# Patient Record
Sex: Male | Born: 1970 | Race: Black or African American | Hispanic: No | Marital: Single | Smoking: Former smoker
Health system: Southern US, Community
[De-identification: ages and names within clinical notes are randomized; demographics above are authoritative.]

## PROBLEM LIST (undated history)

## (undated) HISTORY — PX: HERNIA REPAIR: SHX51

---

## 2010-11-18 ENCOUNTER — Emergency Department (HOSPITAL_COMMUNITY)
Admission: EM | Admit: 2010-11-18 | Discharge: 2010-11-18 | Payer: Self-pay | Source: Home / Self Care | Admitting: Emergency Medicine

## 2010-12-15 ENCOUNTER — Emergency Department (HOSPITAL_COMMUNITY)
Admission: EM | Admit: 2010-12-15 | Discharge: 2010-12-15 | Payer: Self-pay | Source: Home / Self Care | Admitting: Emergency Medicine

## 2016-04-28 ENCOUNTER — Encounter (HOSPITAL_COMMUNITY): Payer: Self-pay | Admitting: Emergency Medicine

## 2016-04-28 ENCOUNTER — Emergency Department (HOSPITAL_COMMUNITY)
Admission: EM | Admit: 2016-04-28 | Discharge: 2016-04-29 | Disposition: A | Discharge status: Home / Self Care | Payer: Self-pay | Attending: Emergency Medicine | Admitting: Emergency Medicine

## 2016-04-28 ENCOUNTER — Emergency Department (HOSPITAL_COMMUNITY): Payer: Self-pay

## 2016-04-28 DIAGNOSIS — F10920 Alcohol use, unspecified with intoxication, uncomplicated: Secondary | ICD-10-CM

## 2016-04-28 DIAGNOSIS — F1290 Cannabis use, unspecified, uncomplicated: Secondary | ICD-10-CM | POA: Insufficient documentation

## 2016-04-28 DIAGNOSIS — F10120 Alcohol abuse with intoxication, uncomplicated: Secondary | ICD-10-CM | POA: Insufficient documentation

## 2016-04-28 DIAGNOSIS — F17200 Nicotine dependence, unspecified, uncomplicated: Secondary | ICD-10-CM | POA: Insufficient documentation

## 2016-04-28 LAB — CBC WITH DIFFERENTIAL/PLATELET
BASOS ABS: 0.1 10*3/uL (ref 0.0–0.1)
BASOS PCT: 1 %
EOS PCT: 1 %
Eosinophils Absolute: 0.1 10*3/uL (ref 0.0–0.7)
HCT: 41.6 % (ref 39.0–52.0)
Hemoglobin: 14.2 g/dL (ref 13.0–17.0)
LYMPHS PCT: 43 %
Lymphs Abs: 4.5 10*3/uL — ABNORMAL HIGH (ref 0.7–4.0)
MCH: 32.8 pg (ref 26.0–34.0)
MCHC: 34.1 g/dL (ref 30.0–36.0)
MCV: 96.1 fL (ref 78.0–100.0)
Monocytes Absolute: 0.5 10*3/uL (ref 0.1–1.0)
Monocytes Relative: 5 %
NEUTROS ABS: 5.4 10*3/uL (ref 1.7–7.7)
Neutrophils Relative %: 50 %
PLATELETS: 292 10*3/uL (ref 150–400)
RBC: 4.33 MIL/uL (ref 4.22–5.81)
RDW: 12.6 % (ref 11.5–15.5)
WBC: 10.6 10*3/uL — AB (ref 4.0–10.5)

## 2016-04-28 LAB — URINALYSIS, ROUTINE W REFLEX MICROSCOPIC
Bilirubin Urine: NEGATIVE
GLUCOSE, UA: NEGATIVE mg/dL
Hgb urine dipstick: NEGATIVE
KETONES UR: NEGATIVE mg/dL
LEUKOCYTES UA: NEGATIVE
NITRITE: NEGATIVE
PH: 5 (ref 5.0–8.0)
Protein, ur: NEGATIVE mg/dL
SPECIFIC GRAVITY, URINE: 1.01 (ref 1.005–1.030)

## 2016-04-28 LAB — COMPREHENSIVE METABOLIC PANEL
ALBUMIN: 3.9 g/dL (ref 3.5–5.0)
ALK PHOS: 89 U/L (ref 38–126)
ALT: 54 U/L (ref 17–63)
ANION GAP: 6 (ref 5–15)
AST: 67 U/L — ABNORMAL HIGH (ref 15–41)
BUN: 9 mg/dL (ref 6–20)
CALCIUM: 8.2 mg/dL — AB (ref 8.9–10.3)
CHLORIDE: 110 mmol/L (ref 101–111)
CO2: 25 mmol/L (ref 22–32)
Creatinine, Ser: 1.02 mg/dL (ref 0.61–1.24)
GFR calc Af Amer: >60 mL/min (ref >60)
GFR calc non Af Amer: >60 mL/min (ref >60)
GLUCOSE: 106 mg/dL — AB (ref 65–99)
POTASSIUM: 3.8 mmol/L (ref 3.5–5.1)
SODIUM: 141 mmol/L (ref 135–145)
Total Bilirubin: 0.6 mg/dL (ref 0.3–1.2)
Total Protein: 8.1 g/dL (ref 6.5–8.1)

## 2016-04-28 LAB — RAPID URINE DRUG SCREEN, HOSP PERFORMED
AMPHETAMINES: NOT DETECTED
BENZODIAZEPINES: NOT DETECTED
Barbiturates: NOT DETECTED
COCAINE: NOT DETECTED
Opiates: NOT DETECTED
Tetrahydrocannabinol: POSITIVE — AB

## 2016-04-28 LAB — ACETAMINOPHEN LEVEL: Acetaminophen (Tylenol), Serum: <10 ug/mL — ABNORMAL LOW (ref 10–30)

## 2016-04-28 LAB — SALICYLATE LEVEL

## 2016-04-28 LAB — ETHANOL: Alcohol, Ethyl (B): 356 mg/dL (ref <5)

## 2016-04-28 MED ORDER — SODIUM CHLORIDE 0.9 % IV BOLUS (SEPSIS)
1000.0000 mL | Freq: Once | INTRAVENOUS | Status: AC
  Administered 2016-04-28: 1000 mL via INTRAVENOUS

## 2016-04-28 NOTE — ED Provider Notes (Signed)
CSN: [id number]     Arrival date & time 04/28/16  2206 History   First MD Initiated Contact with Patient 04/28/16 2216     Chief Complaint  Patient presents with  . Drug Overdose   PT WAS FOUND DOWNTOWN UNRESPONSIVE.  PER EMS, PT WAS HAVING AGONAL RESPIRATIONS AND WAS GIVEN 2 MG OF NARCAN INTRANASALLY.  THE PT IS NOW AWAKE AND ALERT.  HE DENIES HEROIN USE OR ANY OTHER DRUGS.  THE PT SAID HE DRANK A LOT TONIGHT.  IT HAS BEEN RAINING AND PT'S CLOTHES ARE WET.  (Consider location/radiation/quality/duration/timing/severity/associated sxs/prior Treatment) Patient is a 45 y.o. male presenting with Overdose. The history is provided by the patient.  Drug Overdose This is a new problem. The current episode started less than 1 hour ago. The problem occurs constantly. The problem has been rapidly improving. Nothing aggravates the symptoms.    History reviewed. No pertinent past medical history. Past Surgical History  Procedure Laterality Date  . Hernia repair     Family History  Problem Relation Age of Onset  . Aneurysm Mother    Social History  Substance Use Topics  . Smoking status: Current Every Day Smoker  . Smokeless tobacco: None  . Alcohol Use: Yes    Review of Systems  All other systems reviewed and are negative.     Allergies  Review of patient's allergies indicates no known allergies.  Home Medications   Prior to Admission medications   Not on File   BP 136/94 mmHg  Pulse 68  Temp(Src) 97.9 F (36.6 C) (Oral)  Resp 10  SpO2 93% Physical Exam  Constitutional: He is oriented to person, place, and time. He appears well-developed and well-nourished.  HENT:  Head: Normocephalic and atraumatic.  Right Ear: External ear normal.  Left Ear: External ear normal.  Nose: Nose normal.  Mouth/Throat: Oropharynx is clear and moist.  Eyes: Conjunctivae and EOM are normal. Pupils are equal, round, and reactive to light.  Neck: Normal range of motion. Neck supple.   Cardiovascular: Normal rate, regular rhythm, normal heart sounds and intact distal pulses.   Pulmonary/Chest: Effort normal and breath sounds normal.  Abdominal: Soft. Bowel sounds are normal.  Musculoskeletal: Normal range of motion.  Neurological: He is alert and oriented to person, place, and time.  Skin: Skin is warm and dry.  Psychiatric: He has a normal mood and affect. His behavior is normal. Judgment and thought content normal.  Nursing note and vitals reviewed.   ED Course  Procedures (including critical care time) Labs Review Labs Reviewed  COMPREHENSIVE METABOLIC PANEL - Abnormal; Notable for the following:    Glucose, Bld 106 (*)    Calcium 8.2 (*)    AST 67 (*)    All other components within normal limits  ETHANOL - Abnormal; Notable for the following:    Alcohol, Ethyl (B) 356 (*)    All other components within normal limits  CBC WITH DIFFERENTIAL/PLATELET - Abnormal; Notable for the following:    WBC 10.6 (*)    Lymphs Abs 4.5 (*)    All other components within normal limits  URINE RAPID DRUG SCREEN, HOSP PERFORMED - Abnormal; Notable for the following:    Tetrahydrocannabinol POSITIVE (*)    All other components within normal limits  ACETAMINOPHEN LEVEL - Abnormal; Notable for the following:    Acetaminophen (Tylenol), Serum <10 (*)    All other components within normal limits  SALICYLATE LEVEL  URINALYSIS, ROUTINE W REFLEX MICROSCOPIC (NOT AT [facility])  Imaging Review Dg Chest 2 View  04/28/2016  CLINICAL DATA:  Pt states he was downtown and had been drinking and that is all he remembers Pt is alert and oriented at this time No acute distress noted. Unable to articulate any complaints. Smoker. EXAM: CHEST - 2 VIEW COMPARISON:  11/18/2010 FINDINGS: Lungs are clear. Heart size and mediastinal contours are within normal limits. No effusion. Visualized bones unremarkable. IMPRESSION: No acute cardiopulmonary disease. Electronically Signed   By: Corlis Leak  Hassell M.D.   On:  04/28/2016 22:47   I have personally reviewed and evaluated these images and lab results as part of my medical decision-making.   EKG Interpretation   Date/Time:  Wednesday April 28 2016 22:13:24 EDT Ventricular Rate:  70 PR Interval:  194 QRS Duration: 81 QT Interval:  393 QTC Calculation: 424 R Axis:   67 Text Interpretation:  Sinus rhythm RSR' in V1 or V2, probably normal  variant Nonspecific T abnormalities, diffuse leads Confirmed by Nhan Qualley  MD, Jaquanna Ballentine ([id number]) on 04/28/2016 11:08:50 PM      MDM  PLAN IS TO OBSERVE AND REASSESS ONCE PT IS SOBER. Final diagnoses:  Alcohol intoxication, uncomplicated (HCC)     Jacalyn LefevreJulie Tailer Volkert, MD 05/03/16 [phone]

## 2016-04-28 NOTE — ED Notes (Signed)
Pt states he was downtown and had been drinking and that is all he remembers  Pt is alert and oriented at this time  No acute distress noted

## 2016-04-28 NOTE — ED Notes (Signed)
Patient transported to X-ray 

## 2016-04-28 NOTE — ED Notes (Signed)
Per EMS pt was found laying on the sidewalk downtown with agonal respirations  Fire dept gave Narcan 2 mg IN on scene  Pt is now alert and oriented x 3  Pt states he feels tired  Denies opiate or narcotic use  Pt states he has been drinking alcohol and smoking weed

## 2016-04-28 NOTE — ED Notes (Addendum)
Critical value alcohol 356, notified Dr. Particia NearingHaviland and RN.

## 2016-04-29 NOTE — ED Notes (Signed)
Patient given water and a sandwich. Patient asked where he was and stated he doesn't remember anything that happened last night prior to coming to the ED

## 2016-04-29 NOTE — ED Notes (Signed)
Unable to complete CIWA because pt is sleeping; will monitor vital signs and perform CIWA when pt awakens

## 2016-04-29 NOTE — ED Notes (Signed)
Pt ambulated in hall with no assist. 

## 2016-04-29 NOTE — Discharge Instructions (Signed)
Alcohol Intoxication  Alcohol intoxication occurs when the amount of alcohol that a person has consumed impairs his or her ability to mentally and physically function. Alcohol directly impairs the normal chemical activity of the brain. Drinking large amounts of alcohol can lead to changes in mental function and behavior, and it can cause many physical effects that can be harmful.   Alcohol intoxication can range in severity from mild to very severe. Various factors can affect the level of intoxication that occurs, such as the person's age, gender, weight, frequency of alcohol consumption, and the presence of other medical conditions (such as diabetes, seizures, or heart conditions). Dangerous levels of alcohol intoxication may occur when people drink large amounts of alcohol in a short period (binge drinking). Alcohol can also be especially dangerous when combined with certain prescription medicines or "recreational" drugs.  SIGNS AND SYMPTOMS  Some common signs and symptoms of mild alcohol intoxication include:  · Loss of coordination.  · Changes in mood and behavior.  · Impaired judgment.  · Slurred speech.  As alcohol intoxication progresses to more severe levels, other signs and symptoms will appear. These may include:  · Vomiting.  · Confusion and impaired memory.  · Slowed breathing.  · Seizures.  · Loss of consciousness.  DIAGNOSIS   Your health care provider will take a medical history and perform a physical exam. You will be asked about the amount and type of alcohol you have consumed. Blood tests will be done to measure the concentration of alcohol in your blood. In many places, your blood alcohol level must be lower than 80 mg/dL (0.08%) to legally drive. However, many dangerous effects of alcohol can occur at much lower levels.   TREATMENT   People with alcohol intoxication often do not require treatment. Most of the effects of alcohol intoxication are temporary, and they go away as the alcohol naturally  leaves the body. Your health care provider will monitor your condition until you are stable enough to go home. Fluids are sometimes given through an IV access tube to help prevent dehydration.   HOME CARE INSTRUCTIONS  · Do not drive after drinking alcohol.  · Stay hydrated. Drink enough water and fluids to keep your urine clear or pale yellow. Avoid caffeine.    · Only take over-the-counter or prescription medicines as directed by your health care provider.    SEEK MEDICAL CARE IF:   · You have persistent vomiting.    · You do not feel better after a few days.  · You have frequent alcohol intoxication. Your health care provider can help determine if you should see a substance use treatment counselor.  SEEK IMMEDIATE MEDICAL CARE IF:   · You become shaky or tremble when you try to stop drinking.    · You shake uncontrollably (seizure).    · You throw up (vomit) blood. This may be bright red or may look like black coffee grounds.    · You have blood in your stool. This may be bright red or may appear as a black, tarry, bad smelling stool.    · You become lightheaded or faint.    MAKE SURE YOU:   · Understand these instructions.  · Will watch your condition.  · Will get help right away if you are not doing well or get worse.     This information is not intended to replace advice given to you by your health care provider. Make sure you discuss any questions you have with your health care provider.       Document Released: 08/11/2005 Document Revised: 07/04/2013 Document Reviewed: 04/06/2013  Elsevier Interactive Patient Education ©2016 [employer]

## 2016-04-29 NOTE — ED Notes (Signed)
Pt noted to ambulate in hall w/o difficulty or assistance.  Pt c/o generalized soreness.

## 2016-04-29 NOTE — ED Notes (Signed)
Verbalized understanding discharge instructions and given a bus pass. In no acute distress.

## 2016-04-29 NOTE — ED Provider Notes (Signed)
Patient initially seen and evaluated by Dr. Particia NearingHaviland, noted to be intoxicated and was observed in the ED overnight. He slept through the night without incident. In the morning, he was ambulated and was able to ambulate without difficulty. At this point, he was felt to be stable to go home and was discharged.  Dione Boozeavid Bryanah Sidell, MD 04/29/16 [phone]

## 2016-05-10 ENCOUNTER — Encounter (HOSPITAL_COMMUNITY): Payer: Self-pay | Admitting: Emergency Medicine

## 2016-05-10 ENCOUNTER — Emergency Department (HOSPITAL_COMMUNITY)
Admission: EM | Admit: 2016-05-10 | Discharge: 2016-05-10 | Disposition: A | Discharge status: Home / Self Care | Payer: Self-pay | Attending: Emergency Medicine | Admitting: Emergency Medicine

## 2016-05-10 ENCOUNTER — Emergency Department (HOSPITAL_COMMUNITY): Payer: Self-pay

## 2016-05-10 DIAGNOSIS — Y999 Unspecified external cause status: Secondary | ICD-10-CM | POA: Insufficient documentation

## 2016-05-10 DIAGNOSIS — Y929 Unspecified place or not applicable: Secondary | ICD-10-CM | POA: Insufficient documentation

## 2016-05-10 DIAGNOSIS — Y9301 Activity, walking, marching and hiking: Secondary | ICD-10-CM | POA: Insufficient documentation

## 2016-05-10 DIAGNOSIS — W19XXXA Unspecified fall, initial encounter: Secondary | ICD-10-CM

## 2016-05-10 DIAGNOSIS — F17200 Nicotine dependence, unspecified, uncomplicated: Secondary | ICD-10-CM | POA: Insufficient documentation

## 2016-05-10 DIAGNOSIS — W010XXA Fall on same level from slipping, tripping and stumbling without subsequent striking against object, initial encounter: Secondary | ICD-10-CM | POA: Insufficient documentation

## 2016-05-10 DIAGNOSIS — S3210XA Unspecified fracture of sacrum, initial encounter for closed fracture: Secondary | ICD-10-CM | POA: Insufficient documentation

## 2016-05-10 MED ORDER — TRAMADOL HCL 50 MG PO TABS
50.0000 mg | ORAL_TABLET | Freq: Once | ORAL | Status: AC
  Administered 2016-05-10: 50 mg via ORAL
  Filled 2016-05-10: qty 1

## 2016-05-10 MED ORDER — OXYCODONE-ACETAMINOPHEN 5-325 MG PO TABS: 2.0000 | ORAL_TABLET | ORAL | Status: DC | PRN

## 2016-05-10 NOTE — ED Notes (Signed)
EDP at bedside  

## 2016-05-10 NOTE — ED Provider Notes (Signed)
CSN: [id number]     Arrival date & time 05/10/16  0622 History   First MD Initiated Contact with Patient 05/10/16 [phone]     Chief Complaint  Patient presents with  . Back Pain  . Fall   HPI   45 YOM Presents status post fall. Patient reports he was walking backwards trip landed on his body. He reports pain at that time, continued pain through this morning. Patient reports pain with ambulation, pain with palpation. He denies any radiation of symptoms, denies any loss of distal sensation strength or motor function. No other injuries noted. Patient reports using Tylenol this morning with mild relief of symptoms.Patient denies abdominal pain.   History reviewed. No pertinent past medical history. Past Surgical History  Procedure Laterality Date  . Hernia repair     Family History  Problem Relation Age of Onset  . Aneurysm Mother    Social History  Substance Use Topics  . Smoking status: Current Every Day Smoker  . Smokeless tobacco: None  . Alcohol Use: Yes    Review of Systems  All other systems reviewed and are negative.   Allergies  Review of patient's allergies indicates no known allergies.  Home Medications   Prior to Admission medications   Medication Sig Start Date End Date Taking? Authorizing Provider  acetaminophen (TYLENOL) 500 MG tablet Take 500 mg by mouth every 6 (six) hours as needed for mild pain.   Yes Historical Provider, MD  oxyCODONE-acetaminophen (PERCOCET/ROXICET) 5-325 MG tablet Take 2 tablets by mouth every 4 (four) hours as needed for severe pain. 05/10/16   Andre Swander, PA-C   BP 133/95 mmHg  Pulse 67  Temp(Src) 97.9 F (36.6 C) (Oral)  Resp 16  Ht 6\' 2"  (1.88 m)  Wt 79.379 kg  BMI 22.46 kg/m2  SpO2 98%   Physical Exam  Constitutional: He is oriented to person, place, and time. He appears well-developed and well-nourished.  HENT:  Head: Normocephalic and atraumatic.  Eyes: Conjunctivae are normal. Pupils are equal, round, and reactive to  light. Right eye exhibits no discharge. Left eye exhibits no discharge. No scleral icterus.  Neck: Normal range of motion. No JVD present. No tracheal deviation present.  Pulmonary/Chest: Effort normal. No stridor.  Abdominal: Soft. He exhibits no distension and no mass. There is no tenderness. There is no rebound and no guarding.  Musculoskeletal:  No obvious deformities noted of the back or hips. Patient tender to palpation of the sacrum and surrounding soft tissue. Distal motor sensation and strength intact. Patellar reflexes 2+  Neurological: He is alert and oriented to person, place, and time. Coordination normal.  Skin: Skin is warm and dry. No rash noted. No erythema. No pallor.  Psychiatric: He has a normal mood and affect. His behavior is normal. Judgment and thought content normal.  Nursing note and vitals reviewed.   ED Course  Procedures (including critical care time) Labs Review Labs Reviewed - No data to display  Imaging Review Dg Lumbar Spine Complete  05/10/2016  CLINICAL DATA:  Pain following fall 1 day prior EXAM: LUMBAR SPINE - COMPLETE 4+ VIEW COMPARISON:  None. FINDINGS: Frontal, lateral, spot lumbosacral lateral, and bilateral oblique views were obtained. There are 5 non-rib-bearing lumbar type vertebral bodies. There is mild lumbar levoscoliosis. There is a transitional lumbosacral vertebra. There is no fracture or spondylolisthesis. There is slight disc space narrowing at L5-S1. Other disc spaces appear normal. There is no appreciable facet arthropathy. IMPRESSION: Slight disc space narrowing L5-S1. No  fracture or spondylolisthesis. There is mild lumbar levoscoliosis. Electronically Signed   By: Bretta BangWilliam  Woodruff III M.D.   On: 05/10/2016 07:58   Dg Sacrum/coccyx  05/10/2016  CLINICAL DATA:  Pain following fall EXAM: SACRUM AND COCCYX - 2+ VIEW COMPARISON:  None. FINDINGS: Frontal and lateral views were obtained. There is a lucency extending through the midportion of the  sacrum, better seen on the left side. This lucency is concerning for a nondisplaced fracture of the mid sacrum. No other fracture is evident. No diastases. The joint spaces appear normal. No erosive change. IMPRESSION: Evidence of nondisplaced fracture mid sacrum, better seen on the left side. No diastases evident. No appreciable arthropathic change. Electronically Signed   By: Bretta BangWilliam  Woodruff III M.D.   On: 05/10/2016 08:00   I have personally reviewed and evaluated these images and lab results as part of my medical decision-making.   EKG Interpretation None      MDM   Final diagnoses:  Fall  Closed fracture of sacrum, unspecified fracture morphology, initial encounter [facility](HCC)   Labs:   Imaging:  Consults:  Therapeutics:  Discharge Meds:   Assessment/Plan:  45 year old male presents today with sacral fracture. Nondisplaced, pain management in the ED. Patient was given a work note, more thorough follow-up, pain to medication. Strict return precautions given.        Eyvonne MechanicJeffrey Jaimie Pippins, PA-C 05/10/16 [id number]  Blane OharaJoshua Zavitz, MD 05/10/16 [phone]

## 2016-05-10 NOTE — Discharge Instructions (Signed)
Please Follow-up with orthopedic specialist for further evaluation. Please return immediately if new or worsening signs or symptoms present

## 2016-05-10 NOTE — ED Notes (Signed)
Pt verbalized understanding of d/c instructions, prescriptions, and follow-up care. No further questions/concerns, VSS, ambulatory w/ steady gait (refused wheelchair) 

## 2016-05-10 NOTE — ED Notes (Signed)
Pt in reports falling backwards last night and landed on back, no reports 8/10 lower back pain. Denies numbness/tingling, bowel/bladder incontinence.

## 2016-05-29 ENCOUNTER — Encounter (HOSPITAL_COMMUNITY): Payer: Self-pay | Admitting: Emergency Medicine

## 2016-05-29 ENCOUNTER — Emergency Department (HOSPITAL_COMMUNITY)
Admission: EM | Admit: 2016-05-29 | Discharge: 2016-05-30 | Disposition: A | Discharge status: Home / Self Care | Payer: Self-pay | Attending: Emergency Medicine | Admitting: Emergency Medicine

## 2016-05-29 DIAGNOSIS — Z5181 Encounter for therapeutic drug level monitoring: Secondary | ICD-10-CM | POA: Insufficient documentation

## 2016-05-29 DIAGNOSIS — F17200 Nicotine dependence, unspecified, uncomplicated: Secondary | ICD-10-CM | POA: Insufficient documentation

## 2016-05-29 DIAGNOSIS — F1010 Alcohol abuse, uncomplicated: Secondary | ICD-10-CM | POA: Insufficient documentation

## 2016-05-29 DIAGNOSIS — F1410 Cocaine abuse, uncomplicated: Secondary | ICD-10-CM | POA: Insufficient documentation

## 2016-05-29 DIAGNOSIS — F1290 Cannabis use, unspecified, uncomplicated: Secondary | ICD-10-CM | POA: Insufficient documentation

## 2016-05-29 DIAGNOSIS — E876 Hypokalemia: Secondary | ICD-10-CM | POA: Insufficient documentation

## 2016-05-29 LAB — CBC
HEMATOCRIT: 38.7 % — AB (ref 39.0–52.0)
Hemoglobin: 13.3 g/dL (ref 13.0–17.0)
MCH: 33.1 pg (ref 26.0–34.0)
MCHC: 34.4 g/dL (ref 30.0–36.0)
MCV: 96.3 fL (ref 78.0–100.0)
PLATELETS: 227 10*3/uL (ref 150–400)
RBC: 4.02 MIL/uL — ABNORMAL LOW (ref 4.22–5.81)
RDW: 11.9 % (ref 11.5–15.5)
WBC: 8 10*3/uL (ref 4.0–10.5)

## 2016-05-29 LAB — BASIC METABOLIC PANEL
ANION GAP: 7 (ref 5–15)
BUN: 10 mg/dL (ref 6–20)
CALCIUM: 8 mg/dL — AB (ref 8.9–10.3)
CO2: 28 mmol/L (ref 22–32)
Chloride: 109 mmol/L (ref 101–111)
Creatinine, Ser: 0.77 mg/dL (ref 0.61–1.24)
Glucose, Bld: 116 mg/dL — ABNORMAL HIGH (ref 65–99)
Potassium: 3.4 mmol/L — ABNORMAL LOW (ref 3.5–5.1)
SODIUM: 144 mmol/L (ref 135–145)

## 2016-05-29 LAB — ETHANOL: ALCOHOL ETHYL (B): 324 mg/dL — AB (ref <5)

## 2016-05-29 LAB — ACETAMINOPHEN LEVEL

## 2016-05-29 LAB — SALICYLATE LEVEL

## 2016-05-29 MED ORDER — SODIUM CHLORIDE 0.9 % IV BOLUS (SEPSIS)
500.0000 mL | Freq: Once | INTRAVENOUS | Status: AC
Start: 2016-05-29 — End: 2016-05-29
  Administered 2016-05-29: 500 mL via INTRAVENOUS

## 2016-05-29 NOTE — ED Notes (Signed)
Pt found by someone in the lobby bathroom of the [facility] [address], initially found by EMS awake, alert to person, place.  Pt awake, alert on arrival to ED, IV est by EMS, NS 200ml given.

## 2016-05-29 NOTE — ED Provider Notes (Signed)
CSN: [id number]     Arrival date & time 05/29/16  1945 History   By signing my name below, I, Walter Sherman, attest that this documentation has been prepared under the direction and in the presence of Walter Sherman  Electronically Signed: Vista Minkobert Sherman, ED Scribe. 05/29/2016. 8:57 PM   Chief Complaint  Patient presents with  . Alcohol Intoxication   The history is provided by the patient and medical records. No language interpreter was used.  HPI Comments: Walter Sherman is a 45 y.o. male who presents to the Emergency Department s/p being picked up by EMS for alcohol intoxication approximately one hour ago. Per EMS, pt was found in the lobby bathroom of the [facility] [address] Pt was reportedly initially found alert and oriented to time and person. Pt states he did not know where he was found; denies fall or injury. Pt states [birth year] four days ago and that he has been drinking all day. When pt was asked how much he drank today; he reports "I don't know. A lot of things." Pt states that he drinks regularly but denies drinking more than usual today. Pt reports left shoulder pain and soreness but is unable to recall any injury.  He is unable to provide additional hx as he continues to fall asleep.    LEVEL 5 CAVEAT for intoxication.    No past medical history on file. Past Surgical History  Procedure Laterality Date  . Hernia repair     Family History  Problem Relation Age of Onset  . Aneurysm Mother    Social History  Substance Use Topics  . Smoking status: Current Every Day Smoker  . Smokeless tobacco: Not on file  . Alcohol Use: 6.0 oz/week    10 Cans of beer per week     Comment: pt reports drinking several shots and beers tonight    Review of Systems  Unable to perform ROS: Other  Musculoskeletal: Positive for arthralgias (left shoulder).  All other systems reviewed and are negative.     Allergies  Review of patient's allergies indicates no known  allergies.  Home Medications   Prior to Admission medications   Medication Sig Start Date End Date Taking? Authorizing Provider  oxyCODONE-acetaminophen (PERCOCET/ROXICET) 5-325 MG tablet Take 2 tablets by mouth every 4 (four) hours as needed for severe pain. Patient not taking: Reported on 05/29/2016 05/10/16   Eyvonne MechanicJeffrey Hedges, PA-C   BP 129/107 mmHg  Pulse 73  Temp(Src) 97.8 F (36.6 C) (Oral)  Resp 23  SpO2 99% Physical Exam  Constitutional: He appears well-developed and well-nourished. He appears lethargic. No distress.  Patient lethargic  HENT:  Head: Normocephalic and atraumatic.  Mouth/Throat: Oropharynx is clear and moist. No oropharyngeal exudate.  Eyes: Conjunctivae are normal. Pupils are equal, round, and reactive to light. No scleral icterus.  PERRL but sluggish  Neck: Normal range of motion. Neck supple.  No midline or paraspinal tenderness.  Full passive and active ROM  Cardiovascular: Normal rate, regular rhythm, normal heart sounds and intact distal pulses.   Pulmonary/Chest: Effort normal and breath sounds normal. No respiratory distress. He has no wheezes.  Equal chest expansion  Abdominal: Soft. Bowel sounds are normal. He exhibits no mass. There is no tenderness. There is no rebound and no guarding.  Musculoskeletal: Normal range of motion. He exhibits no edema.  Full ROM of bilateral upper and lower extremities. No joint swelling, edema or deformities. Pt complains of pain to the left  shoulder on ROM and palpation. No midline t-spine or l-spine tenderness.   Neurological: He appears lethargic.  Speech is garbled but not slurred; pt answers questions appropriately when stimulated He is oriented to person and place Moves extremities without ataxia  Skin: Skin is warm and dry. No rash noted. He is not diaphoretic.  Psychiatric: He has a normal mood and affect.  Nursing note and vitals reviewed.   ED Course  Procedures  DIAGNOSTIC STUDIES: Oxygen Saturation  is 94% on RA, adequate by my interpretation.  COORDINATION OF CARE: 8:37 PM-Will order blood work. Discussed treatment plan with pt at bedside and pt agreed to plan.   Labs Review Labs Reviewed  CBC - Abnormal; Notable for the following:    RBC 4.02 (*)    HCT 38.7 (*)    All other components within normal limits  BASIC METABOLIC PANEL - Abnormal; Notable for the following:    Potassium 3.4 (*)    Glucose, Bld 116 (*)    Calcium 8.0 (*)    All other components within normal limits  ETHANOL - Abnormal; Notable for the following:    Alcohol, Ethyl (B) 324 (*)    All other components within normal limits  ACETAMINOPHEN LEVEL - Abnormal; Notable for the following:    Acetaminophen (Tylenol), Serum <10 (*)    All other components within normal limits  URINE RAPID DRUG SCREEN, HOSP PERFORMED - Abnormal; Notable for the following:    Cocaine POSITIVE (*)    All other components within normal limits  SALICYLATE LEVEL     MDM   Final diagnoses:  ETOH abuse  Cocaine abuse  Hypokalemia  Walter Sherman presents with acute EtOH intoxication.  Pt denies fall and undressed exam does not reveal visible injury to the head, neck or body.  Pt c/o soreness in the left shoulder but has FROM without difficulty or significant pain.    2:01 AM Labs are reassuring. Patient is alert, oriented and ambulatory without difficulty. He is tolerating by mouth. He states that he feels better. Patient now admits to alcohol and cocaine usage.  He continues to move all joints of the bilateral upper and lower extremities without difficulty.  No other new complaints of pain. He is clinically sober and stable for discharge home.  Mild hypokalemia repleated in the department.    I personally performed the services described in this documentation, which was scribed in my presence. The recorded information has been reviewed and is accurate.   Walter Client [name], PA-C 05/30/16 1610  Nelva Nay, MD 05/30/16  [phone]

## 2016-05-29 NOTE — ED Notes (Signed)
Bed: RESA Expected date:  Expected time:  Means of arrival:  Comments: [age] M Unresponsive/ETOH

## 2016-05-29 NOTE — ED Notes (Signed)
PT presents with alcohol intoxication after drinking throughout the day. PT currently alert and reporting nausea. Pt oriented to time and person.

## 2016-05-30 LAB — RAPID URINE DRUG SCREEN, HOSP PERFORMED
AMPHETAMINES: NOT DETECTED
BARBITURATES: NOT DETECTED
BENZODIAZEPINES: NOT DETECTED
COCAINE: POSITIVE — AB
Opiates: NOT DETECTED
Tetrahydrocannabinol: NOT DETECTED

## 2016-05-30 MED ORDER — POTASSIUM CHLORIDE CRYS ER 20 MEQ PO TBCR
40.0000 meq | EXTENDED_RELEASE_TABLET | Freq: Two times a day (BID) | ORAL | Status: DC
  Administered 2016-05-30: 40 meq via ORAL
  Filled 2016-05-30: qty 2

## 2016-05-30 NOTE — ED Notes (Signed)
Pt resting on stretcher with eyes closed, RR even and unlabored, pt will be d/c closer to morning, ok per charge.

## 2016-05-30 NOTE — ED Notes (Signed)
HOB raised, pt now sitting up, given Sprite and crackers.

## 2016-05-30 NOTE — ED Notes (Signed)
Pt now eating sandwich and drinking sprite

## 2016-05-30 NOTE — Discharge Instructions (Signed)
1. Medications: usual home medications °2. Treatment: rest, drink plenty of fluids,  °3. Follow Up: Please followup with your primary doctor in 2-3 days for discussion of your diagnoses and further evaluation after today's visit; if you do not have a primary care doctor use the resource guide provided to find one; Please return to the ER for worsening symptoms ° °

## 2016-05-30 NOTE — ED Notes (Signed)
Pt given bus pass for transportation. Pt verbalized understanding of d/c instructions.

## 2016-05-30 NOTE — ED Notes (Signed)
Pt resting on stretcher with eyes closed, RR even and unlabored,. Pt unable to stay awake to complete conversation

## 2016-06-02 ENCOUNTER — Emergency Department (HOSPITAL_COMMUNITY): Payer: Self-pay

## 2016-06-02 ENCOUNTER — Emergency Department (HOSPITAL_COMMUNITY)
Admission: EM | Admit: 2016-06-02 | Discharge: 2016-06-03 | Disposition: A | Discharge status: Home / Self Care | Payer: Self-pay | Attending: Emergency Medicine | Admitting: Emergency Medicine

## 2016-06-02 ENCOUNTER — Encounter (HOSPITAL_COMMUNITY): Payer: Self-pay | Admitting: *Deleted

## 2016-06-02 DIAGNOSIS — S02640A Fracture of ramus of mandible, unspecified side, initial encounter for closed fracture: Secondary | ICD-10-CM

## 2016-06-02 DIAGNOSIS — S02642A Fracture of ramus of left mandible, initial encounter for closed fracture: Secondary | ICD-10-CM | POA: Insufficient documentation

## 2016-06-02 DIAGNOSIS — S0990XA Unspecified injury of head, initial encounter: Secondary | ICD-10-CM | POA: Insufficient documentation

## 2016-06-02 DIAGNOSIS — S30811A Abrasion of abdominal wall, initial encounter: Secondary | ICD-10-CM | POA: Insufficient documentation

## 2016-06-02 DIAGNOSIS — Y999 Unspecified external cause status: Secondary | ICD-10-CM | POA: Insufficient documentation

## 2016-06-02 DIAGNOSIS — F17200 Nicotine dependence, unspecified, uncomplicated: Secondary | ICD-10-CM | POA: Insufficient documentation

## 2016-06-02 DIAGNOSIS — Y939 Activity, unspecified: Secondary | ICD-10-CM | POA: Insufficient documentation

## 2016-06-02 DIAGNOSIS — Y92512 Supermarket, store or market as the place of occurrence of the external cause: Secondary | ICD-10-CM | POA: Insufficient documentation

## 2016-06-02 DIAGNOSIS — F10120 Alcohol abuse with intoxication, uncomplicated: Secondary | ICD-10-CM | POA: Insufficient documentation
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
## 2016-06-02 NOTE — ED Notes (Signed)
Pt ambulated to nursing station in pod B, upset about pain in R ribcage and requesting food. Security to bedside. Pt assisted back to room. Given ginger ale to drink until CT results. Pt cooperative at this time

## 2016-06-03 ENCOUNTER — Emergency Department (HOSPITAL_COMMUNITY)
Admission: EM | Admit: 2016-06-03 | Discharge: 2016-06-04 | Disposition: A | Discharge status: Home / Self Care | Payer: Self-pay | Attending: Emergency Medicine | Admitting: Emergency Medicine

## 2016-06-03 ENCOUNTER — Emergency Department (HOSPITAL_COMMUNITY): Payer: Self-pay

## 2016-06-03 ENCOUNTER — Encounter (HOSPITAL_COMMUNITY): Payer: Self-pay | Admitting: *Deleted

## 2016-06-03 DIAGNOSIS — Y939 Activity, unspecified: Secondary | ICD-10-CM | POA: Insufficient documentation

## 2016-06-03 DIAGNOSIS — Z7982 Long term (current) use of aspirin: Secondary | ICD-10-CM | POA: Insufficient documentation

## 2016-06-03 DIAGNOSIS — Z79899 Other long term (current) drug therapy: Secondary | ICD-10-CM | POA: Insufficient documentation

## 2016-06-03 DIAGNOSIS — F17210 Nicotine dependence, cigarettes, uncomplicated: Secondary | ICD-10-CM | POA: Insufficient documentation

## 2016-06-03 DIAGNOSIS — F1490 Cocaine use, unspecified, uncomplicated: Secondary | ICD-10-CM | POA: Insufficient documentation

## 2016-06-03 DIAGNOSIS — F1290 Cannabis use, unspecified, uncomplicated: Secondary | ICD-10-CM | POA: Insufficient documentation

## 2016-06-03 DIAGNOSIS — S01412A Laceration without foreign body of left cheek and temporomandibular area, initial encounter: Secondary | ICD-10-CM | POA: Insufficient documentation

## 2016-06-03 DIAGNOSIS — S0285XA Fracture of orbit, unspecified, initial encounter for closed fracture: Secondary | ICD-10-CM

## 2016-06-03 DIAGNOSIS — S0232XA Fracture of orbital floor, left side, initial encounter for closed fracture: Secondary | ICD-10-CM | POA: Insufficient documentation

## 2016-06-03 DIAGNOSIS — IMO0002 Reserved for concepts with insufficient information to code with codable children: Secondary | ICD-10-CM

## 2016-06-03 DIAGNOSIS — Y999 Unspecified external cause status: Secondary | ICD-10-CM | POA: Insufficient documentation

## 2016-06-03 DIAGNOSIS — Y929 Unspecified place or not applicable: Secondary | ICD-10-CM | POA: Insufficient documentation

## 2016-06-03 DIAGNOSIS — S022XXA Fracture of nasal bones, initial encounter for closed fracture: Secondary | ICD-10-CM | POA: Insufficient documentation

## 2016-06-03 MED ORDER — ACETAMINOPHEN 500 MG PO TABS: 1000.0000 mg | ORAL_TABLET | Freq: Four times a day (QID) | ORAL | Status: DC | PRN

## 2016-06-03 MED ORDER — LIDOCAINE HCL (PF) 1 % IJ SOLN
5.0000 mL | Freq: Once | INTRAMUSCULAR | Status: AC
  Administered 2016-06-04: 5 mL
  Filled 2016-06-03: qty 30

## 2016-06-03 MED ORDER — HYDROCODONE-ACETAMINOPHEN 5-325 MG PO TABS
2.0000 | ORAL_TABLET | Freq: Once | ORAL | Status: AC
  Administered 2016-06-03: 2 via ORAL
  Filled 2016-06-03: qty 2
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
## 2016-06-04 NOTE — Discharge Instructions (Signed)
You have fractures of your left orbit, or the bones surrounding your left eye. Please follow-up with the specialists listed above in 3 days. Do NOT blow your nose.  The sutures in your left cheek will dissolve on there own. Return for redness, swelling, fever, or pus.

## 2016-06-04 NOTE — ED Notes (Signed)
Waiting for mother to pick him up.

## 2016-06-09 ENCOUNTER — Emergency Department (HOSPITAL_COMMUNITY)
Admission: EM | Admit: 2016-06-09 | Discharge: 2016-06-09 | Disposition: A | Discharge status: Home / Self Care | Payer: Self-pay | Attending: Emergency Medicine | Admitting: Emergency Medicine

## 2016-06-09 ENCOUNTER — Encounter (HOSPITAL_COMMUNITY): Payer: Self-pay | Admitting: Emergency Medicine

## 2016-06-09 DIAGNOSIS — Z87891 Personal history of nicotine dependence: Secondary | ICD-10-CM | POA: Insufficient documentation

## 2016-06-09 DIAGNOSIS — G43909 Migraine, unspecified, not intractable, without status migrainosus: Secondary | ICD-10-CM | POA: Insufficient documentation

## 2016-06-09 DIAGNOSIS — Z5321 Procedure and treatment not carried out due to patient leaving prior to being seen by health care provider: Secondary | ICD-10-CM | POA: Insufficient documentation

## 2016-06-09 NOTE — ED Triage Notes (Signed)
Pt c/o left facial swelling x5 days ago. Pt reports "hard bump" to left eye since yesterday. Denies SOB, throat/tongue swelling. Pt also reports headache.

## 2016-06-09 NOTE — ED Notes (Signed)
Patient left AMA.

## 2016-06-09 NOTE — ED Provider Notes (Signed)
Patient not in room when I went to see him   Mancel Bale, MD 06/09/16 [phone]

## 2016-06-10 ENCOUNTER — Emergency Department
Admission: EM | Admit: 2016-06-10 | Discharge: 2016-06-10 | Disposition: A | Discharge status: Home / Self Care | Payer: Self-pay | Attending: Emergency Medicine | Admitting: Emergency Medicine

## 2016-06-10 ENCOUNTER — Emergency Department: Payer: Self-pay

## 2016-06-10 ENCOUNTER — Encounter: Payer: Self-pay | Admitting: Emergency Medicine

## 2016-06-10 DIAGNOSIS — S0285XA Fracture of orbit, unspecified, initial encounter for closed fracture: Secondary | ICD-10-CM

## 2016-06-10 DIAGNOSIS — F1490 Cocaine use, unspecified, uncomplicated: Secondary | ICD-10-CM | POA: Insufficient documentation

## 2016-06-10 DIAGNOSIS — Z79899 Other long term (current) drug therapy: Secondary | ICD-10-CM | POA: Insufficient documentation

## 2016-06-10 DIAGNOSIS — S0510XA Contusion of eyeball and orbital tissues, unspecified eye, initial encounter: Secondary | ICD-10-CM

## 2016-06-10 DIAGNOSIS — IMO0001 Reserved for inherently not codable concepts without codable children: Secondary | ICD-10-CM

## 2016-06-10 DIAGNOSIS — Y999 Unspecified external cause status: Secondary | ICD-10-CM | POA: Insufficient documentation

## 2016-06-10 DIAGNOSIS — Z87891 Personal history of nicotine dependence: Secondary | ICD-10-CM | POA: Insufficient documentation

## 2016-06-10 DIAGNOSIS — S0232XA Fracture of orbital floor, left side, initial encounter for closed fracture: Secondary | ICD-10-CM | POA: Insufficient documentation

## 2016-06-10 DIAGNOSIS — Y929 Unspecified place or not applicable: Secondary | ICD-10-CM | POA: Insufficient documentation

## 2016-06-10 DIAGNOSIS — F1290 Cannabis use, unspecified, uncomplicated: Secondary | ICD-10-CM | POA: Insufficient documentation

## 2016-06-10 DIAGNOSIS — H5789 Other specified disorders of eye and adnexa: Secondary | ICD-10-CM

## 2016-06-10 DIAGNOSIS — T1490XA Injury, unspecified, initial encounter: Secondary | ICD-10-CM

## 2016-06-10 DIAGNOSIS — S0280XG Fracture of other specified skull and facial bones, unspecified side, subsequent encounter for fracture with delayed healing: Secondary | ICD-10-CM

## 2016-06-10 DIAGNOSIS — R51 Headache: Secondary | ICD-10-CM | POA: Insufficient documentation

## 2016-06-10 DIAGNOSIS — Y939 Activity, unspecified: Secondary | ICD-10-CM | POA: Insufficient documentation

## 2016-06-10 MED ORDER — OXYCODONE-ACETAMINOPHEN 5-325 MG PO TABS
2.0000 | ORAL_TABLET | Freq: Once | ORAL | Status: DC
  Filled 2016-06-10: qty 2

## 2016-06-10 MED ORDER — LIDOCAINE-EPINEPHRINE (PF) 1 %-1:200000 IJ SOLN
INTRAMUSCULAR | Status: AC
  Filled 2016-06-10: qty 30

## 2016-06-10 MED ORDER — AMOXICILLIN-POT CLAVULANATE 875-125 MG PO TABS: 1.0000 | ORAL_TABLET | Freq: Two times a day (BID) | ORAL | 0 refills | Status: DC

## 2016-06-10 MED ORDER — OXYCODONE-ACETAMINOPHEN 5-325 MG PO TABS: 1.0000 | ORAL_TABLET | ORAL | 0 refills | Status: DC | PRN

## 2016-06-10 MED ORDER — GENTAMICIN SULFATE 0.1 % EX OINT: 1.0000 "application " | TOPICAL_OINTMENT | Freq: Three times a day (TID) | CUTANEOUS | 0 refills | Status: AC
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
## 2016-06-10 NOTE — ED Notes (Signed)
Pt moved to major room 26 via w/c  Report called to [employer]
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
## 2016-06-10 NOTE — ED Notes (Signed)
Visual acuity right eye 20/10 with no difficulty.  Left eye 20/20 but can only read the first 3 letters of this line before it becomes too blurry to make out.  20/30 with no difficulty

## 2016-06-10 NOTE — ED Provider Notes (Signed)
-----------------------------------------   5:28 PM on 06/10/2016 -----------------------------------------  Dr. Andee Poles saw the patient in the ED.  He wrote prescriptions for pain and will see the patient in a week in clinic.     Loleta Rose, MD 06/10/16 [phone]

## 2017-04-10 ENCOUNTER — Emergency Department (HOSPITAL_COMMUNITY): Payer: Self-pay

## 2017-04-10 ENCOUNTER — Encounter (HOSPITAL_COMMUNITY): Payer: Self-pay | Admitting: Emergency Medicine

## 2017-04-10 ENCOUNTER — Emergency Department (HOSPITAL_COMMUNITY)
Admission: EM | Admit: 2017-04-10 | Discharge: 2017-04-10 | Disposition: A | Discharge status: Home / Self Care | Payer: Self-pay | Attending: Emergency Medicine | Admitting: Emergency Medicine

## 2017-04-10 DIAGNOSIS — S0101XA Laceration without foreign body of scalp, initial encounter: Secondary | ICD-10-CM | POA: Insufficient documentation

## 2017-04-10 DIAGNOSIS — Y9289 Other specified places as the place of occurrence of the external cause: Secondary | ICD-10-CM | POA: Insufficient documentation

## 2017-04-10 DIAGNOSIS — F10920 Alcohol use, unspecified with intoxication, uncomplicated: Secondary | ICD-10-CM | POA: Insufficient documentation

## 2017-04-10 DIAGNOSIS — S0003XA Contusion of scalp, initial encounter: Secondary | ICD-10-CM

## 2017-04-10 DIAGNOSIS — Y999 Unspecified external cause status: Secondary | ICD-10-CM | POA: Insufficient documentation

## 2017-04-10 DIAGNOSIS — Y93G9 Activity, other involving cooking and grilling: Secondary | ICD-10-CM | POA: Insufficient documentation

## 2017-04-10 DIAGNOSIS — W0110XA Fall on same level from slipping, tripping and stumbling with subsequent striking against unspecified object, initial encounter: Secondary | ICD-10-CM | POA: Insufficient documentation

## 2017-04-10 DIAGNOSIS — Z87891 Personal history of nicotine dependence: Secondary | ICD-10-CM | POA: Insufficient documentation

## 2017-04-10 DIAGNOSIS — F1210 Cannabis abuse, uncomplicated: Secondary | ICD-10-CM | POA: Insufficient documentation

## 2017-04-10 LAB — BASIC METABOLIC PANEL
Anion gap: 9 (ref 5–15)
BUN: 12 mg/dL (ref 6–20)
CO2: 26 mmol/L (ref 22–32)
Calcium: 8.2 mg/dL — ABNORMAL LOW (ref 8.9–10.3)
Chloride: 108 mmol/L (ref 101–111)
Creatinine, Ser: 1.07 mg/dL (ref 0.61–1.24)
GFR calc Af Amer: >60 mL/min (ref >60)
GFR calc non Af Amer: >60 mL/min (ref >60)
Glucose, Bld: 91 mg/dL (ref 65–99)
Potassium: 3.7 mmol/L (ref 3.5–5.1)
Sodium: 143 mmol/L (ref 135–145)

## 2017-04-10 LAB — CBC WITH DIFFERENTIAL/PLATELET
Basophils Absolute: 0 10*3/uL (ref 0.0–0.1)
Basophils Relative: 0 %
Eosinophils Absolute: 0.1 10*3/uL (ref 0.0–0.7)
Eosinophils Relative: 1 %
HCT: 39.8 % (ref 39.0–52.0)
Hemoglobin: 13.7 g/dL (ref 13.0–17.0)
Lymphocytes Relative: 32 %
Lymphs Abs: 2.9 10*3/uL (ref 0.7–4.0)
MCH: 33.4 pg (ref 26.0–34.0)
MCHC: 34.4 g/dL (ref 30.0–36.0)
MCV: 97.1 fL (ref 78.0–100.0)
Monocytes Absolute: 1 10*3/uL (ref 0.1–1.0)
Monocytes Relative: 11 %
Neutro Abs: 5.1 10*3/uL (ref 1.7–7.7)
Neutrophils Relative %: 56 %
Platelets: 199 10*3/uL (ref 150–400)
RBC: 4.1 MIL/uL — ABNORMAL LOW (ref 4.22–5.81)
RDW: 13.2 % (ref 11.5–15.5)
WBC: 9.2 10*3/uL (ref 4.0–10.5)

## 2017-04-10 LAB — ETHANOL: Alcohol, Ethyl (B): 197 mg/dL — ABNORMAL HIGH (ref <5)

## 2017-04-10 MED ORDER — LIDOCAINE-EPINEPHRINE (PF) 2 %-1:200000 IJ SOLN
10.0000 mL | Freq: Once | INTRAMUSCULAR | Status: AC
  Administered 2017-04-10: 10 mL
  Filled 2017-04-10: qty 20

## 2017-04-10 MED ORDER — BACITRACIN ZINC 500 UNIT/GM EX OINT
TOPICAL_OINTMENT | Freq: Once | CUTANEOUS | Status: DC
  Filled 2017-04-10: qty 0.9
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
## 2017-04-10 NOTE — Discharge Instructions (Signed)
Wash your wound 1-2 times daily. Apply antibiotic ointment daily. Please return to emergency department in 10-14 days for staple removal. Please return sooner if you develop any signs of infection including fever, redness, swelling, increased pain, or drainage from the area.

## 2017-04-10 NOTE — ED Notes (Signed)
PA sts pt needs to sober up before he can be d/c
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
## 2017-04-10 NOTE — ED Notes (Signed)
Pt sleeping quietly at this time.

## 2017-04-10 NOTE — ED Notes (Signed)
Ham sandwich and apple juice given

## 2017-04-10 NOTE — ED Notes (Signed)
PA at bedside to staple wound
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
## 2017-04-10 NOTE — ED Notes (Signed)
Pt wont wake up to urinate

## 2017-12-15 IMAGING — CR DG SACRUM/COCCYX 2+V
3 series · 3 of 3 positions shown · non-contrast
Comparison: None.

CLINICAL DATA: Pain following fall

EXAM:
SACRUM AND COCCYX - 2+ VIEW

[coccyx ap]
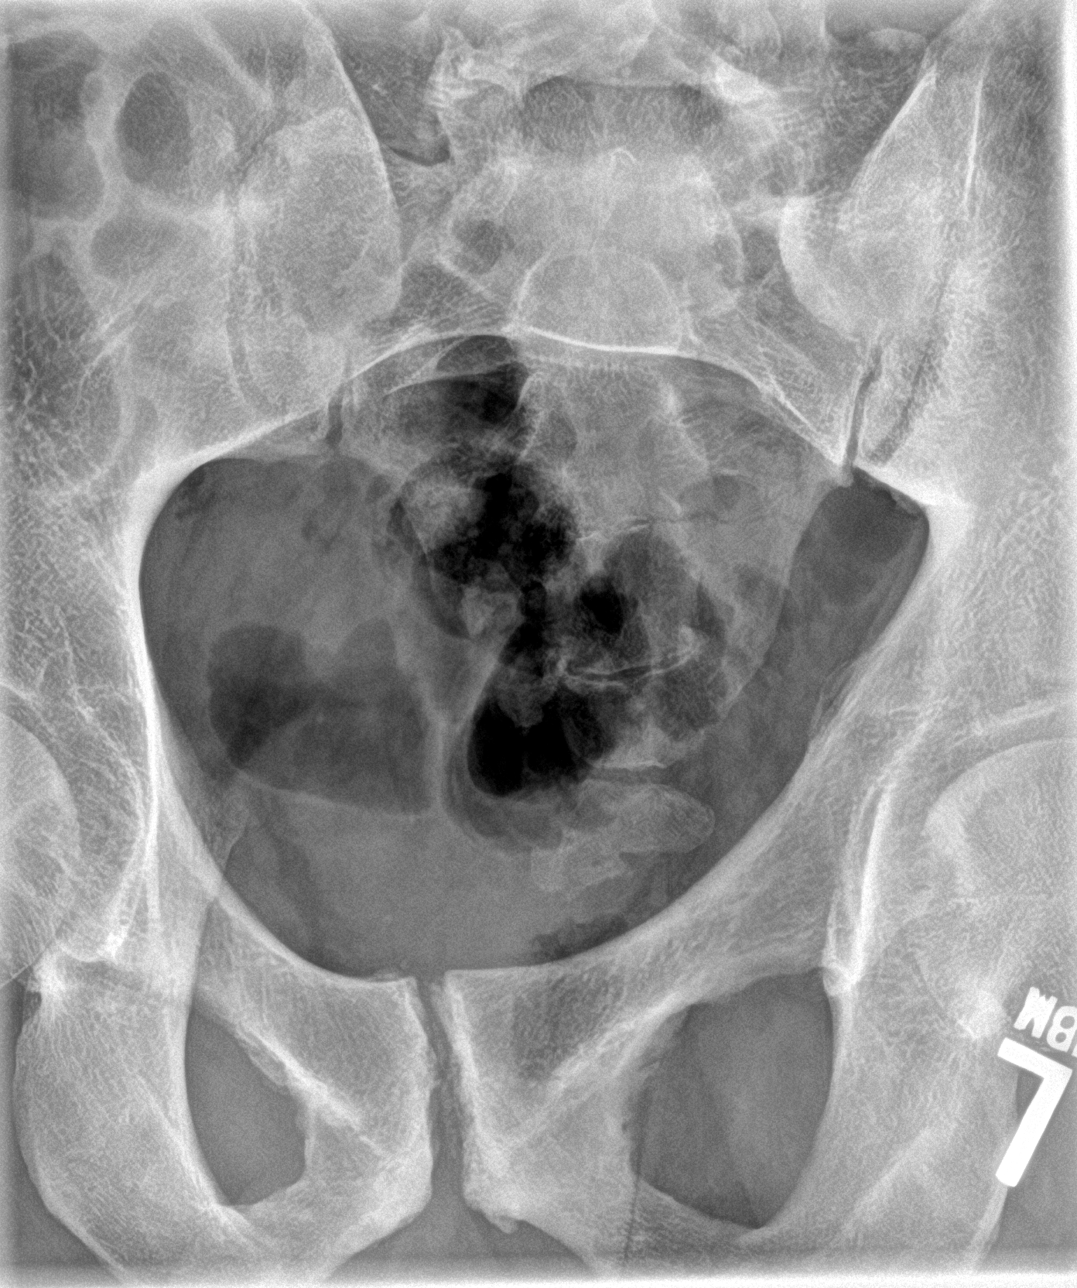

[sacrum ap]
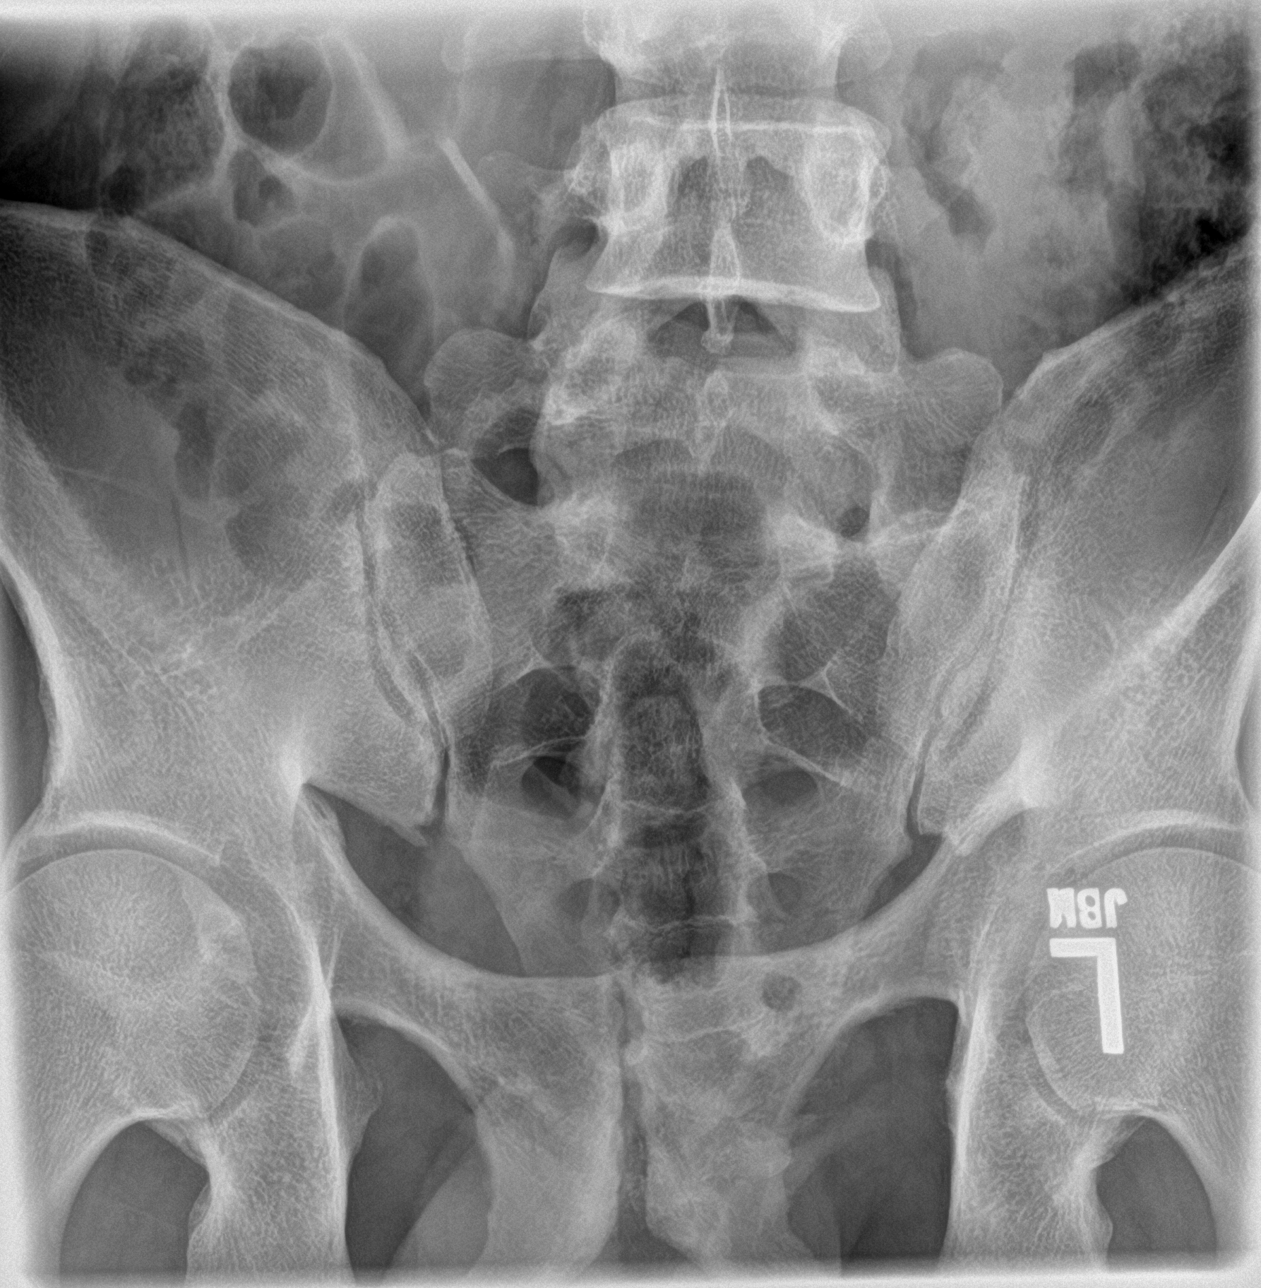

[sacrum lat]
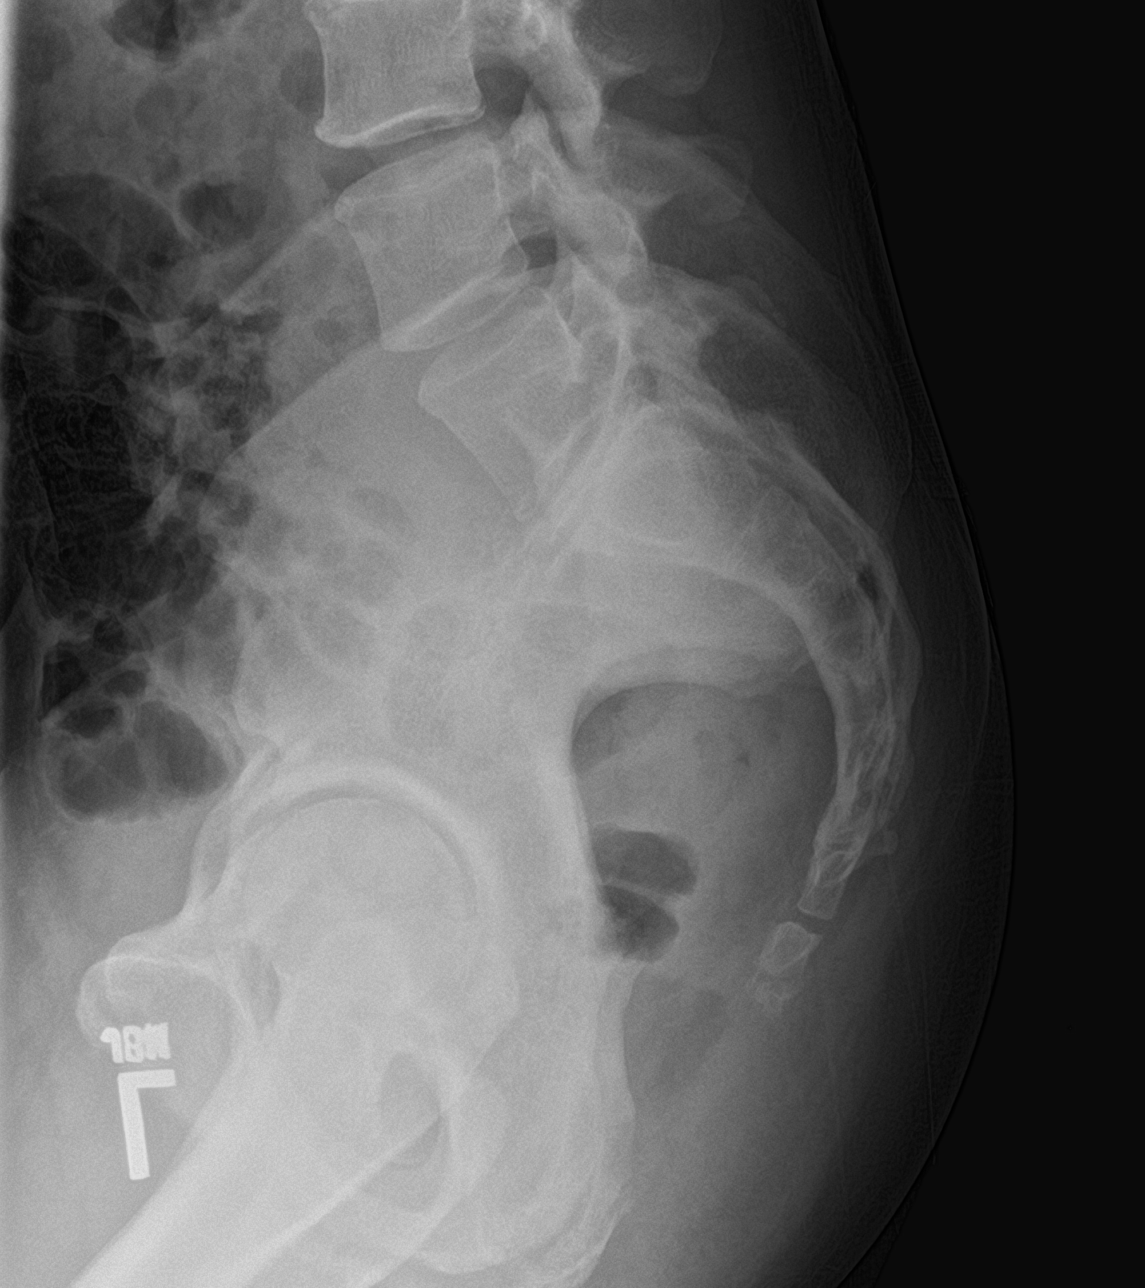

[3 of 3 positions shown; findings below may reference images not displayed]

FINDINGS: Frontal and lateral views were obtained. There is a lucency
extending through the midportion of the sacrum, better seen on the
left side. This lucency is concerning for a nondisplaced fracture of
the mid sacrum. No other fracture is evident. No diastases. The
joint spaces appear normal. No erosive change.
IMPRESSION: Evidence of nondisplaced fracture mid sacrum, better seen on the
left side. No diastases evident. No appreciable arthropathic change.

## 2017-12-15 IMAGING — CR DG LUMBAR SPINE COMPLETE 4+V
5 series · 5 of 5 positions shown · non-contrast
Comparison: None.

CLINICAL DATA: Pain following fall 1 day prior

EXAM:
LUMBAR SPINE - COMPLETE 4+ VIEW

[l-spine ap]
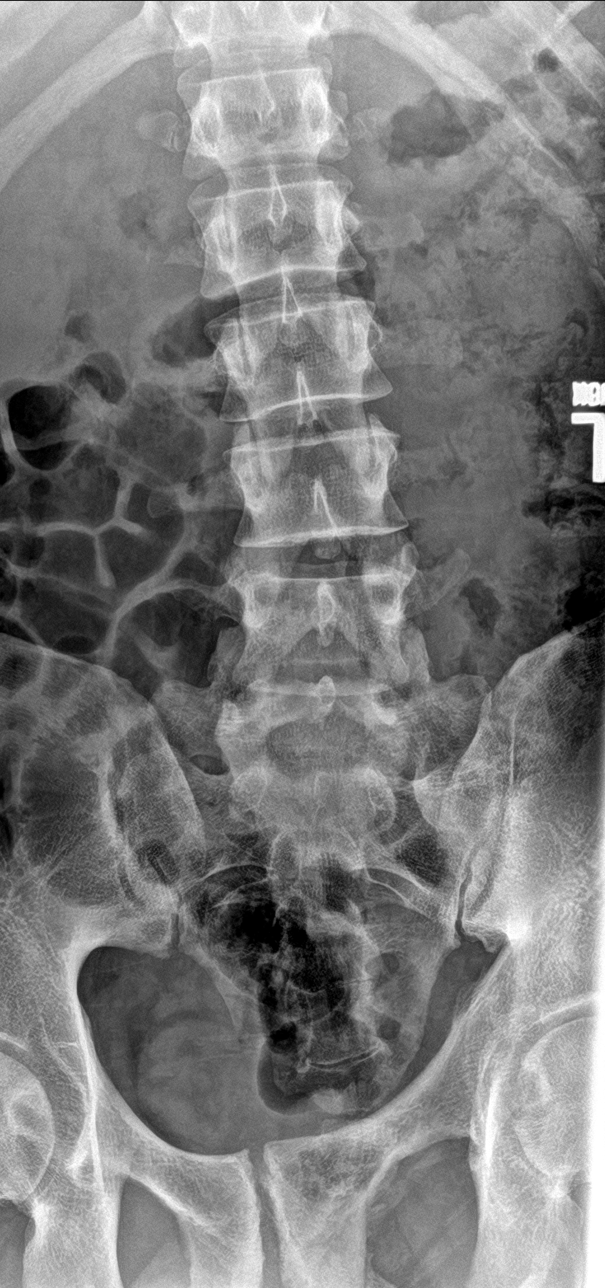

[l-spine obl (1 of 2)]
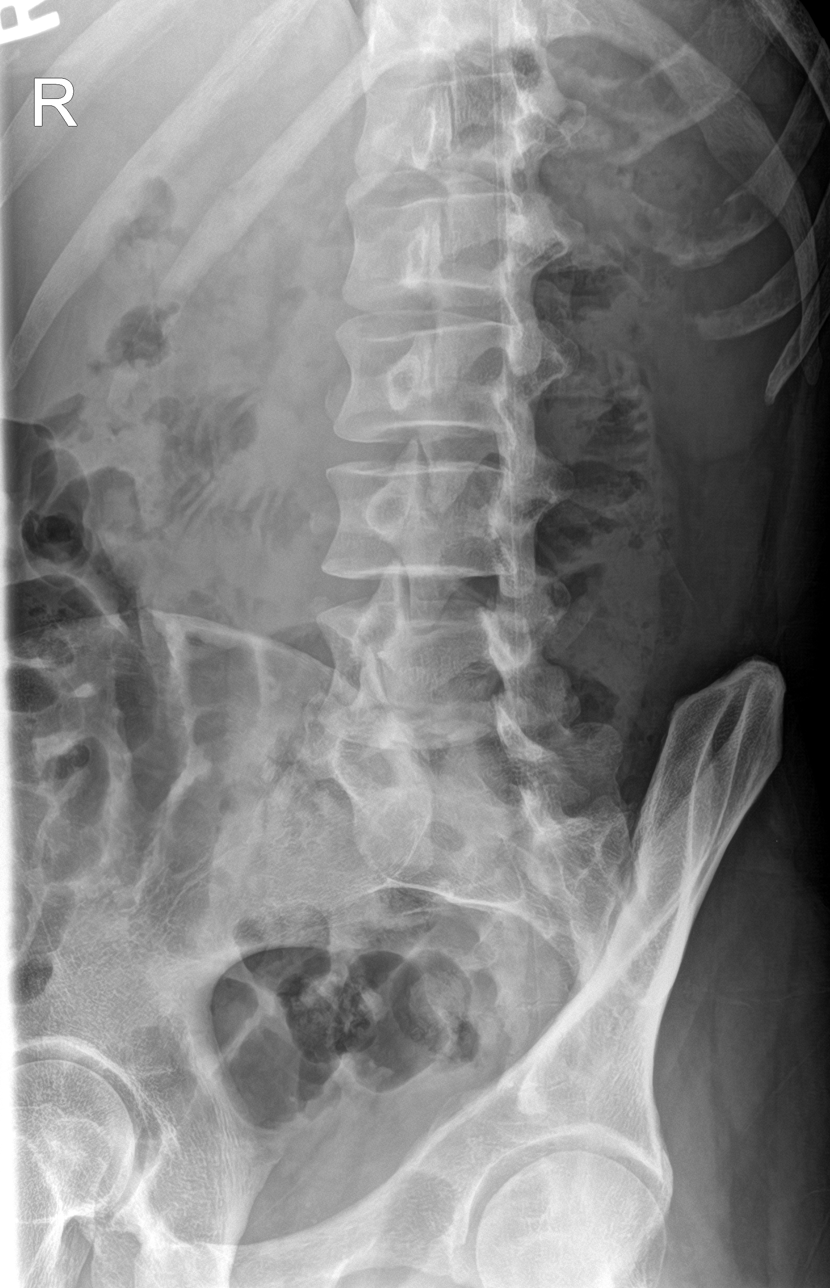

[l-spine obl (2 of 2)]
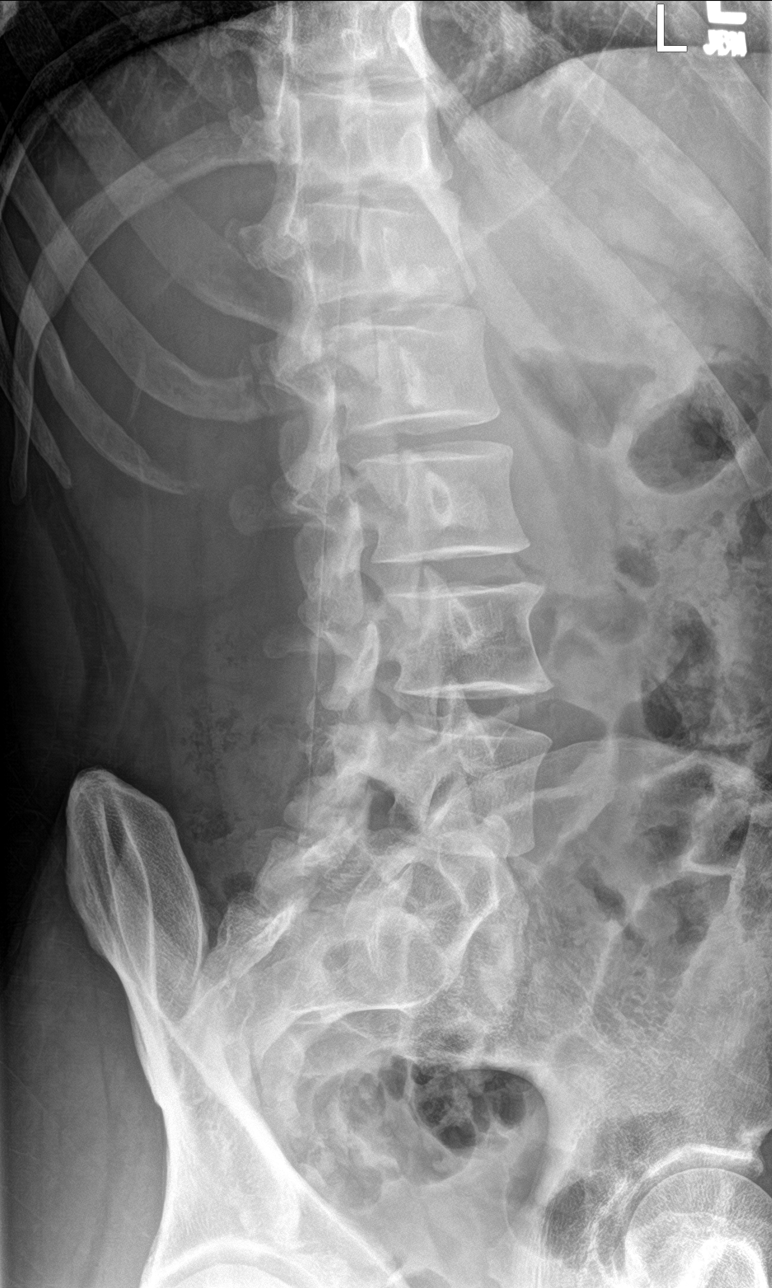

[l-spine lat]
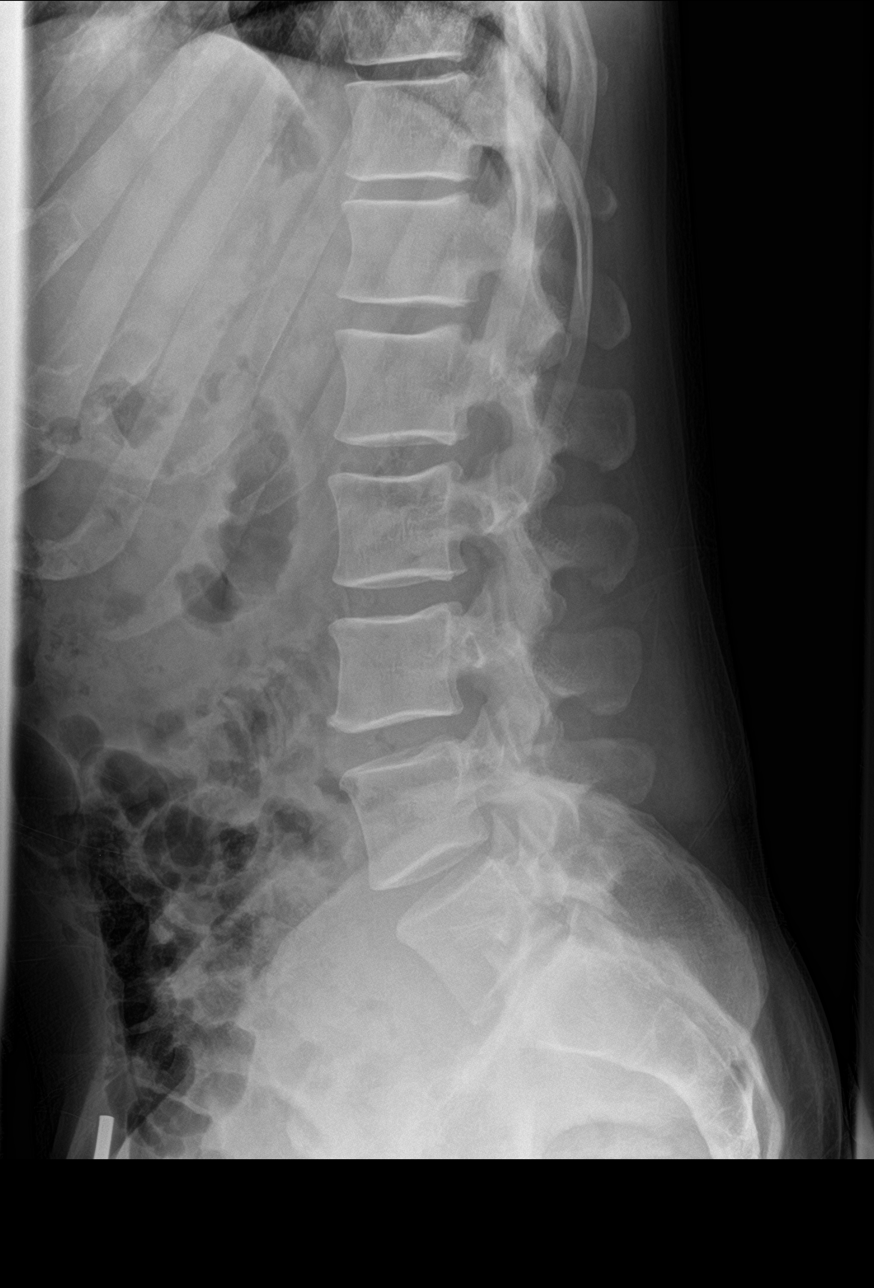

[l-spine spot]
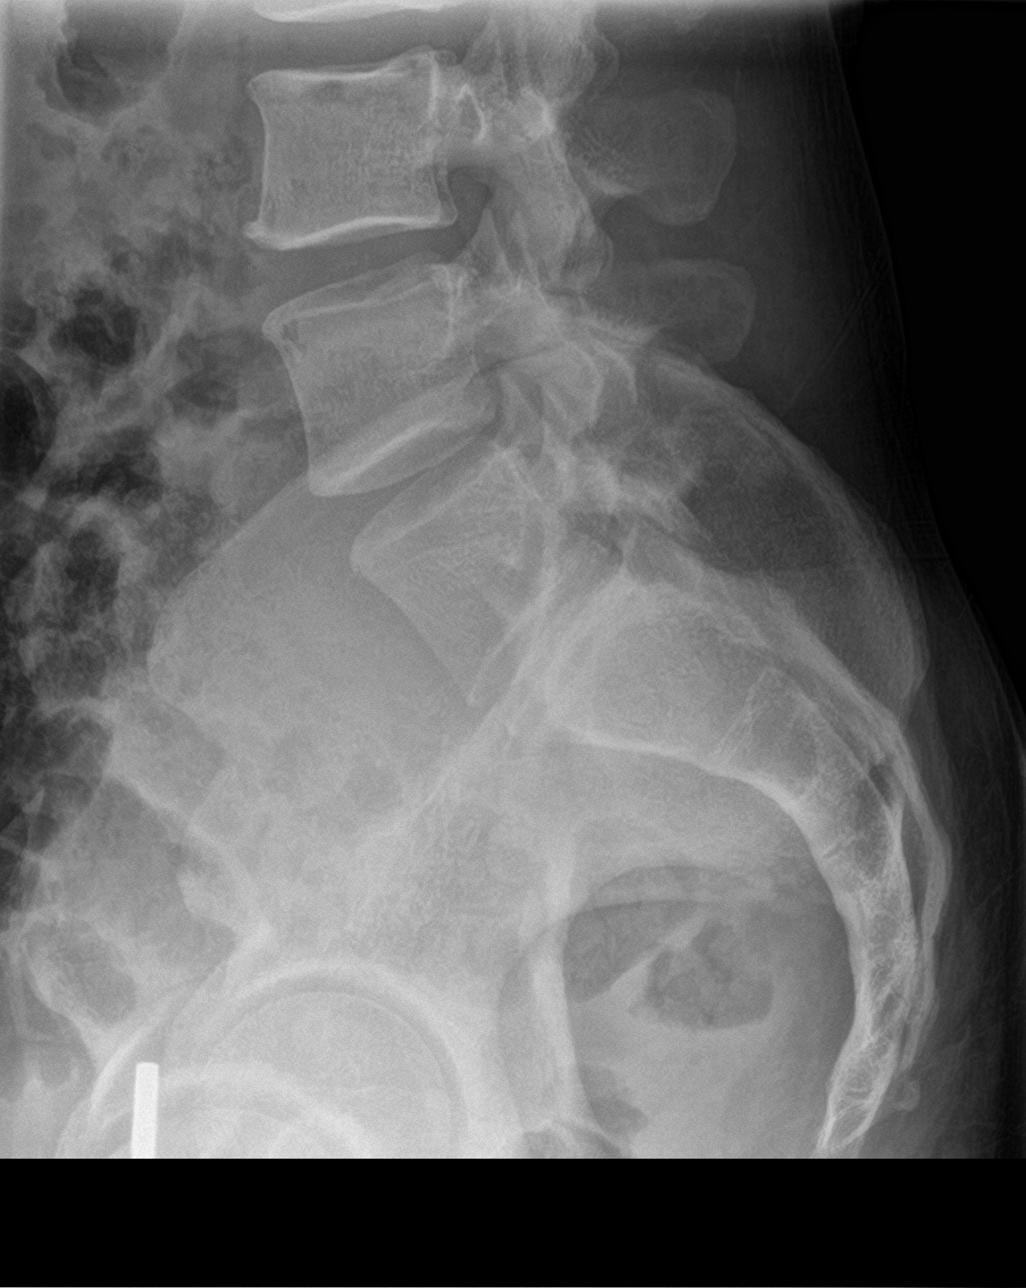

[5 of 5 positions shown; findings below may reference images not displayed]

FINDINGS: Frontal, lateral, spot lumbosacral lateral, and bilateral oblique
views were obtained. There are 5 non-rib-bearing lumbar type
vertebral bodies. There is mild lumbar levoscoliosis. There is a
transitional lumbosacral vertebra. There is no fracture or
spondylolisthesis. There is slight disc space narrowing at L5-S1.
Other disc spaces appear normal. There is no appreciable facet
arthropathy.
IMPRESSION: Slight disc space narrowing L5-S1. No fracture or spondylolisthesis.
There is mild lumbar levoscoliosis.

## 2020-03-15 ENCOUNTER — Encounter (HOSPITAL_COMMUNITY): Payer: Self-pay

## 2020-03-15 ENCOUNTER — Emergency Department (HOSPITAL_COMMUNITY)
Admission: EM | Admit: 2020-03-15 | Discharge: 2020-03-16 | Disposition: A | Discharge status: Home / Self Care | Payer: Self-pay | Attending: Emergency Medicine | Admitting: Emergency Medicine

## 2020-03-15 ENCOUNTER — Other Ambulatory Visit: Payer: Self-pay

## 2020-03-15 DIAGNOSIS — R400 Somnolence: Secondary | ICD-10-CM | POA: Insufficient documentation

## 2020-03-15 DIAGNOSIS — S0181XA Laceration without foreign body of other part of head, initial encounter: Secondary | ICD-10-CM | POA: Insufficient documentation

## 2020-03-15 DIAGNOSIS — Y908 Blood alcohol level of 240 mg/100 ml or more: Secondary | ICD-10-CM | POA: Insufficient documentation

## 2020-03-15 DIAGNOSIS — Y9389 Activity, other specified: Secondary | ICD-10-CM | POA: Insufficient documentation

## 2020-03-15 DIAGNOSIS — Y929 Unspecified place or not applicable: Secondary | ICD-10-CM | POA: Insufficient documentation

## 2020-03-15 DIAGNOSIS — S60417A Abrasion of left little finger, initial encounter: Secondary | ICD-10-CM | POA: Insufficient documentation

## 2020-03-15 DIAGNOSIS — Y999 Unspecified external cause status: Secondary | ICD-10-CM | POA: Insufficient documentation

## 2020-03-15 DIAGNOSIS — R1031 Right lower quadrant pain: Secondary | ICD-10-CM | POA: Insufficient documentation

## 2020-03-15 DIAGNOSIS — F10929 Alcohol use, unspecified with intoxication, unspecified: Secondary | ICD-10-CM | POA: Insufficient documentation

## 2020-03-15 DIAGNOSIS — F10920 Alcohol use, unspecified with intoxication, uncomplicated: Secondary | ICD-10-CM

## 2020-03-15 DIAGNOSIS — Z23 Encounter for immunization: Secondary | ICD-10-CM | POA: Insufficient documentation

## 2020-03-15 DIAGNOSIS — S6992XA Unspecified injury of left wrist, hand and finger(s), initial encounter: Secondary | ICD-10-CM | POA: Insufficient documentation

## 2020-03-15 DIAGNOSIS — S3991XA Unspecified injury of abdomen, initial encounter: Secondary | ICD-10-CM | POA: Insufficient documentation

## 2020-03-15 DIAGNOSIS — S01111A Laceration without foreign body of right eyelid and periocular area, initial encounter: Secondary | ICD-10-CM | POA: Insufficient documentation
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
## 2020-03-15 NOTE — ED Notes (Signed)
Pt uncooperative at this time, pt not wanting to move up into the chair, stating it is too hard and doesn't want to move so I could get vitals.
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
## 2020-03-16 ENCOUNTER — Encounter (HOSPITAL_COMMUNITY): Payer: Self-pay

## 2020-03-16 ENCOUNTER — Emergency Department (HOSPITAL_COMMUNITY): Payer: Self-pay

## 2020-03-16 LAB — CBC WITH DIFFERENTIAL/PLATELET
Abs Immature Granulocytes: 0.1 10*3/uL — ABNORMAL HIGH (ref 0.00–0.07)
Basophils Absolute: 0.1 10*3/uL (ref 0.0–0.1)
Basophils Relative: 1 %
Eosinophils Absolute: 0 10*3/uL (ref 0.0–0.5)
Eosinophils Relative: 1 %
HCT: 37.8 % — ABNORMAL LOW (ref 39.0–52.0)
Hemoglobin: 12.5 g/dL — ABNORMAL LOW (ref 13.0–17.0)
Immature Granulocytes: 2 %
Lymphocytes Relative: 24 %
Lymphs Abs: 1.4 10*3/uL (ref 0.7–4.0)
MCH: 33.2 pg (ref 26.0–34.0)
MCHC: 33.1 g/dL (ref 30.0–36.0)
MCV: 100.5 fL — ABNORMAL HIGH (ref 80.0–100.0)
Monocytes Absolute: 0.5 10*3/uL (ref 0.1–1.0)
Monocytes Relative: 8 %
Neutro Abs: 4 10*3/uL (ref 1.7–7.7)
Neutrophils Relative %: 64 %
Platelets: 221 10*3/uL (ref 150–400)
RBC: 3.76 MIL/uL — ABNORMAL LOW (ref 4.22–5.81)
RDW: 13.2 % (ref 11.5–15.5)
WBC: 6.1 10*3/uL (ref 4.0–10.5)
nRBC: 0 % (ref 0.0–0.2)

## 2020-03-16 LAB — URINALYSIS, ROUTINE W REFLEX MICROSCOPIC
Bacteria, UA: NONE SEEN
Bilirubin Urine: NEGATIVE
Glucose, UA: NEGATIVE mg/dL
Ketones, ur: NEGATIVE mg/dL
Leukocytes,Ua: NEGATIVE
Nitrite: NEGATIVE
Protein, ur: NEGATIVE mg/dL
Specific Gravity, Urine: >1.046 — ABNORMAL HIGH (ref 1.005–1.030)
pH: 5 (ref 5.0–8.0)

## 2020-03-16 LAB — ETHANOL: Alcohol, Ethyl (B): 381 mg/dL (ref <10)

## 2020-03-16 LAB — RAPID URINE DRUG SCREEN, HOSP PERFORMED
Amphetamines: NOT DETECTED
Barbiturates: NOT DETECTED
Benzodiazepines: NOT DETECTED
Cocaine: POSITIVE — AB
Opiates: NOT DETECTED
Tetrahydrocannabinol: POSITIVE — AB

## 2020-03-16 LAB — COMPREHENSIVE METABOLIC PANEL
ALT: 90 U/L — ABNORMAL HIGH (ref 0–44)
AST: 266 U/L — ABNORMAL HIGH (ref 15–41)
Albumin: 3.8 g/dL (ref 3.5–5.0)
Alkaline Phosphatase: 86 U/L (ref 38–126)
Anion gap: 7 (ref 5–15)
BUN: 10 mg/dL (ref 6–20)
CO2: 27 mmol/L (ref 22–32)
Calcium: 8 mg/dL — ABNORMAL LOW (ref 8.9–10.3)
Chloride: 109 mmol/L (ref 98–111)
Creatinine, Ser: 0.85 mg/dL (ref 0.61–1.24)
GFR calc Af Amer: >60 mL/min (ref >60)
GFR calc non Af Amer: >60 mL/min (ref >60)
Glucose, Bld: 106 mg/dL — ABNORMAL HIGH (ref 70–99)
Potassium: 3.9 mmol/L (ref 3.5–5.1)
Sodium: 143 mmol/L (ref 135–145)
Total Bilirubin: 0.8 mg/dL (ref 0.3–1.2)
Total Protein: 8.8 g/dL — ABNORMAL HIGH (ref 6.5–8.1)

## 2020-03-16 LAB — ACETAMINOPHEN LEVEL: Acetaminophen (Tylenol), Serum: <10 ug/mL — ABNORMAL LOW (ref 10–30)

## 2020-03-16 LAB — CBG MONITORING, ED: Glucose-Capillary: 112 mg/dL — ABNORMAL HIGH (ref 70–99)

## 2020-03-16 LAB — SALICYLATE LEVEL: Salicylate Lvl: <7 mg/dL — ABNORMAL LOW (ref 7.0–30.0)

## 2020-03-16 MED ORDER — CEPHALEXIN 500 MG PO CAPS: 500.0000 mg | ORAL_CAPSULE | Freq: Four times a day (QID) | ORAL | 0 refills | Status: AC

## 2020-03-16 MED ORDER — IOHEXOL 300 MG/ML  SOLN
100.0000 mL | Freq: Once | INTRAMUSCULAR | Status: AC | PRN
  Administered 2020-03-16: 100 mL via INTRAVENOUS

## 2020-03-16 MED ORDER — LIDOCAINE-EPINEPHRINE (PF) 2 %-1:200000 IJ SOLN
INTRAMUSCULAR | Status: AC
  Filled 2020-03-16: qty 20

## 2020-03-16 MED ORDER — LIDOCAINE-EPINEPHRINE 2 %-1:100000 IJ SOLN
20.0000 mL | Freq: Once | INTRAMUSCULAR | Status: AC
  Administered 2020-03-16: 20 mL

## 2020-03-16 MED ORDER — CEPHALEXIN 500 MG PO CAPS
500.0000 mg | ORAL_CAPSULE | Freq: Four times a day (QID) | ORAL | 0 refills | Status: DC
Start: 2020-03-16 — End: 2020-03-16

## 2020-03-16 MED ORDER — TETANUS-DIPHTH-ACELL PERTUSSIS 5-2.5-18.5 LF-MCG/0.5 IM SUSP
0.5000 mL | Freq: Once | INTRAMUSCULAR | Status: AC
  Administered 2020-03-16: 0.5 mL via INTRAMUSCULAR
  Filled 2020-03-16: qty 0.5

## 2020-03-16 MED ORDER — SODIUM CHLORIDE (PF) 0.9 % IJ SOLN
INTRAMUSCULAR | Status: AC
  Filled 2020-03-16: qty 50

## 2020-03-16 NOTE — ED Notes (Signed)
Patient given water to PO challenge. 
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
## 2020-03-16 NOTE — ED Notes (Signed)
Lidocaine epi at bedside. 

## 2020-03-16 NOTE — Care Management (Signed)
  MATCH Medication Assistance Card Name: Estanislado Surgeon ID (MRN): [id number] Bin: [id number] RX Group: [id number] Discharge Date: 03/16/2020 Expiration Date: 03/28/2020                                           (must be filled within 7 days of discharge)          Dear   :   Walter Sherman  You have been approved to have the prescriptions written by your discharging physician filled through our [facility] (Medication Assistance Through [facility]) program. This program allows for a one-time (no refills) 34-day supply of selected medications for a low copay amount.  The copay is $3.00 per prescription. For instance, if you have one prescription, you will pay $3.00; for two prescriptions, you pay $6.00; for three prescriptions, you pay $9.00; and so on.  Only certain pharmacies are participating in this program with [facility]. You will need to select one of the pharmacies from the attached list and take your prescriptions, this letter, and your photo ID to one of the participating pharmacies.   We are excited that you are able to use the [facility] program to get your medications. These prescriptions must be filled within 7 days of hospital discharge or they will no longer be valid for the [place] [place] program. Should you have any problems with your prescriptions please contact your case management team member at [phone] for [place]/ [facility].  Thank you, [facility] Health Care Management

## 2020-03-16 NOTE — Care Management (Signed)
MATCH letter sent to [facility] on [address] At patient's request

## 2020-03-16 NOTE — ED Notes (Signed)
Patient ambulated to restroom without assistance, gait steady. No obvious signs of distress.

## 2020-03-16 NOTE — ED Notes (Signed)
Date and time results received: 03/16/20 1:34 AM    Test: Alcohol Critical Value: 381  Name of Provider Notified: Dr Manus Gunning

## 2020-03-16 NOTE — ED Notes (Signed)
Providers at bedside.

## 2020-03-16 NOTE — Discharge Instructions (Addendum)
Follow-up for suture removal in 7 days.  Take the antibiotics for your broken nose.  Do not blow your nose.  Keep the head of the bed elevated while you are sleeping.  Reduce your alcohol intake.  Follow-up with a hand surgeon regarding a deformity of your finger. Return to the ED with new or worsening symptoms

## 2020-03-16 NOTE — ED Notes (Signed)
Patient urinated while in CT. Unable to obtain urine sample.

## 2020-03-16 NOTE — ED Provider Notes (Signed)
..  Laceration Repair  Date/Time: 03/16/2020 6:18 AM Performed by: Barkley Boards, PA-C Authorized by: Barkley Boards, PA-C   Consent:    Consent obtained:  Verbal   Consent given by:  Patient   Risks discussed:  Infection, pain and retained foreign body   Alternatives discussed:  No treatment Anesthesia (see MAR for exact dosages):    Anesthesia method:  Local infiltration   Local anesthetic:  Lidocaine 1% WITH epi Laceration details:    Location:  Face   Face location:  L eyebrow   Length (cm):  1.5 Repair type:    Repair type:  Intermediate Pre-procedure details:    Preparation:  Patient was prepped and draped in usual sterile fashion and imaging obtained to evaluate for foreign bodies Exploration:    Hemostasis achieved with:  Epinephrine   Wound exploration: wound explored through full range of motion and entire depth of wound probed and visualized     Wound extent: no fascia violation noted, no foreign bodies/material noted, no muscle damage noted, no nerve damage noted, no tendon damage noted, no underlying fracture noted and no vascular damage noted     Contaminated: no   Treatment:    Area cleansed with:  Betadine   Amount of cleaning:  Extensive   Visualized foreign bodies/material removed: no   Skin repair:    Repair method:  Sutures   Suture size:  5-0   Wound skin closure material used: ethilon    Suture technique:  Simple interrupted   Number of sutures:  7 Approximation:    Approximation:  Close Post-procedure details:    Dressing:  Open (no dressing)   Patient tolerance of procedure:  Tolerated well, no immediate complications      Barkley Boards, PA-C 03/16/20 [id number]    Glynn Octave, MD 03/16/20 [phone]
